# Patient Record
Sex: Male | Born: 1980 | Hispanic: No | Marital: Single | Smoking: Never smoker
Health system: Southern US, Community
[De-identification: ages and names within clinical notes are randomized; demographics above are authoritative.]

## PROBLEM LIST (undated history)

## (undated) DIAGNOSIS — I10 Essential (primary) hypertension: Secondary | ICD-10-CM

---

## 2002-06-24 ENCOUNTER — Emergency Department (HOSPITAL_COMMUNITY): Admission: EM | Admit: 2002-06-24 | Discharge: 2002-06-24 | Payer: Self-pay | Admitting: *Deleted

## 2018-04-01 ENCOUNTER — Emergency Department (HOSPITAL_COMMUNITY)
Admission: EM | Admit: 2018-04-01 | Discharge: 2018-04-01 | Disposition: A | Discharge status: Home / Self Care | Payer: Self-pay | Attending: Emergency Medicine | Admitting: Emergency Medicine

## 2018-04-01 ENCOUNTER — Emergency Department (HOSPITAL_COMMUNITY): Payer: Self-pay

## 2018-04-01 ENCOUNTER — Other Ambulatory Visit: Payer: Self-pay

## 2018-04-01 ENCOUNTER — Encounter (HOSPITAL_COMMUNITY): Payer: Self-pay

## 2018-04-01 DIAGNOSIS — N131 Hydronephrosis with ureteral stricture, not elsewhere classified: Secondary | ICD-10-CM | POA: Insufficient documentation

## 2018-04-01 DIAGNOSIS — R9431 Abnormal electrocardiogram [ECG] [EKG]: Secondary | ICD-10-CM | POA: Insufficient documentation

## 2018-04-01 DIAGNOSIS — N200 Calculus of kidney: Secondary | ICD-10-CM

## 2018-04-01 DIAGNOSIS — R112 Nausea with vomiting, unspecified: Secondary | ICD-10-CM | POA: Insufficient documentation

## 2018-04-01 DIAGNOSIS — N201 Calculus of ureter: Secondary | ICD-10-CM | POA: Insufficient documentation

## 2018-04-01 LAB — CBC
HCT: 39.7 % (ref 39.0–52.0)
Hemoglobin: 13.4 g/dL (ref 13.0–17.0)
MCH: 28.2 pg (ref 26.0–34.0)
MCHC: 33.8 g/dL (ref 30.0–36.0)
MCV: 83.6 fL (ref 78.0–100.0)
Platelets: 280 10*3/uL (ref 150–400)
RBC: 4.75 MIL/uL (ref 4.22–5.81)
RDW: 13.1 % (ref 11.5–15.5)
WBC: 6.3 10*3/uL (ref 4.0–10.5)

## 2018-04-01 LAB — URINALYSIS, MICROSCOPIC (REFLEX)
Bacteria, UA: NONE SEEN
Squamous Epithelial / LPF: NONE SEEN (ref 0–5)

## 2018-04-01 LAB — URINALYSIS, ROUTINE W REFLEX MICROSCOPIC
Bilirubin Urine: NEGATIVE
Glucose, UA: NEGATIVE mg/dL
Ketones, ur: NEGATIVE mg/dL
Leukocytes, UA: NEGATIVE
Nitrite: NEGATIVE
Protein, ur: NEGATIVE mg/dL
Specific Gravity, Urine: 1.025 (ref 1.005–1.030)
pH: 5.5 (ref 5.0–8.0)

## 2018-04-01 LAB — BASIC METABOLIC PANEL
Anion gap: 9 (ref 5–15)
BUN: 9 mg/dL (ref 6–20)
CO2: 20 mmol/L — ABNORMAL LOW (ref 22–32)
Calcium: 8.3 mg/dL — ABNORMAL LOW (ref 8.9–10.3)
Chloride: 110 mmol/L (ref 101–111)
Creatinine, Ser: 1.03 mg/dL (ref 0.61–1.24)
GFR calc Af Amer: >60 mL/min (ref >60)
GFR calc non Af Amer: >60 mL/min (ref >60)
Glucose, Bld: 128 mg/dL — ABNORMAL HIGH (ref 65–99)
Potassium: 4.1 mmol/L (ref 3.5–5.1)
Sodium: 139 mmol/L (ref 135–145)

## 2018-04-01 LAB — I-STAT TROPONIN, ED: Troponin i, poc: 0 ng/mL (ref 0.00–0.08)

## 2018-04-01 LAB — D-DIMER, QUANTITATIVE: D-Dimer, Quant: <0.27 ug/mL-FEU (ref 0.00–0.50)

## 2018-04-01 MED ORDER — MORPHINE SULFATE (PF) 4 MG/ML IV SOLN
4.0000 mg | Freq: Once | INTRAVENOUS | Status: AC
  Administered 2018-04-01: 4 mg via INTRAVENOUS
  Filled 2018-04-01: qty 1

## 2018-04-01 MED ORDER — TRAMADOL HCL 50 MG PO TABS: 50.0000 mg | ORAL_TABLET | Freq: Four times a day (QID) | ORAL | 0 refills | Status: AC | PRN

## 2018-04-01 MED ORDER — TAMSULOSIN HCL 0.4 MG PO CAPS: 0.4000 mg | ORAL_CAPSULE | Freq: Every day | ORAL | 0 refills | Status: AC

## 2018-04-01 MED ORDER — ONDANSETRON HCL 4 MG/2ML IJ SOLN
4.0000 mg | Freq: Once | INTRAMUSCULAR | Status: AC
  Administered 2018-04-01: 4 mg via INTRAVENOUS
  Filled 2018-04-01: qty 2

## 2018-04-01 MED ORDER — IBUPROFEN 400 MG PO TABS: 400.0000 mg | ORAL_TABLET | Freq: Three times a day (TID) | ORAL | 0 refills | Status: AC

## 2018-04-01 MED ORDER — METOCLOPRAMIDE HCL 5 MG/ML IJ SOLN
10.0000 mg | Freq: Once | INTRAMUSCULAR | Status: AC
  Administered 2018-04-01: 10 mg via INTRAVENOUS
  Filled 2018-04-01: qty 2

## 2018-04-01 MED ORDER — KETOROLAC TROMETHAMINE 30 MG/ML IJ SOLN
15.0000 mg | Freq: Once | INTRAMUSCULAR | Status: AC
  Administered 2018-04-01: 15 mg via INTRAVENOUS
  Filled 2018-04-01: qty 1

## 2018-04-01 NOTE — ED Notes (Signed)
ST elevation noted on EMS EKG.

## 2018-04-01 NOTE — ED Provider Notes (Signed)
[facility]-EMERGENCY DEPT Provider Note   CSN: [id number] Arrival date & time: 04/01/18  [id number]     History   Chief Complaint No chief complaint on file.   HPI Thomas Owen is a 37 y.o. male.  HPI  Presents with acute onset pain in the right flank, and right inguinal greater than lower abdomen. Onset was about 2 hours ago, since onset pain is been persistent, is sore, severe, sharp. No medication taken for relief. There is associated nausea, vomiting. Patient has not urinated since onset, but has had no change in spite of a bowel movement. He states that he is generally well, denies medical problems.   History reviewed. No pertinent past medical history.  There are no active problems to display for this patient.   History reviewed. No pertinent surgical history.      Home Medications    Prior to Admission medications   Medication Sig Start Date End Date Taking? Authorizing Provider  ibuprofen (ADVIL,MOTRIN) 200 MG tablet Take 600 mg by mouth every 6 (six) hours as needed for moderate pain.   Yes [provider]    Family History No family history on file.  Social History Social History   Tobacco Use  . Smoking status: Never Smoker  . Smokeless tobacco: Never Used  Substance Use Topics  . Alcohol use: Never    Frequency: Never  . Drug use: Never     Allergies   Patient has no known allergies.   Review of Systems Review of Systems  Constitutional:       Per HPI, otherwise negative  HENT:       Per HPI, otherwise negative  Respiratory:       Per HPI, otherwise negative  Cardiovascular:       Per HPI, otherwise negative  Gastrointestinal: Positive for nausea and vomiting.  Endocrine:       Negative aside from HPI  Genitourinary:       Neg aside from HPI   Musculoskeletal:       Per HPI, otherwise negative  Skin: Negative.   Neurological: Negative for syncope.     Physical Exam Updated Vital Signs BP  115/74   Pulse 77   Temp (!) 97.4 F (36.3 C) (Oral)   Resp 17   Ht 5\' 6"  (1.676 m)   Wt 81.6 kg (180 lb)   SpO2 98%   BMI 29.05 kg/m   Physical Exam  Constitutional: He is oriented to person, place, and time. He appears well-developed. No distress.  HENT:  Head: Normocephalic and atraumatic.  Eyes: Conjunctivae and EOM are normal.  Cardiovascular: Normal rate and regular rhythm.  Pulmonary/Chest: Effort normal. No stridor. No respiratory distress.  Abdominal: He exhibits no distension. There is tenderness in the right lower quadrant and suprapubic area. There is CVA tenderness.  Musculoskeletal: He exhibits no edema.  Neurological: He is alert and oriented to person, place, and time.  Skin: Skin is warm and dry.  Psychiatric: He has a normal mood and affect.  Nursing note and vitals reviewed.    ED Treatments / Results  Labs (all labs ordered are listed, but only abnormal results are displayed) Labs Reviewed  URINALYSIS, ROUTINE W REFLEX MICROSCOPIC - Abnormal; Notable for the following components:      Result Value   Hgb urine dipstick TRACE (*)    All other components within normal limits  BASIC METABOLIC PANEL - Abnormal; Notable for the following components:   CO2 20 (*)  Glucose, Bld 128 (*)    Calcium 8.3 (*)    All other components within normal limits  CBC  D-DIMER, QUANTITATIVE (NOT AT [facility])  URINALYSIS, MICROSCOPIC (REFLEX)  I-STAT TROPONIN, ED    EKG EKG Interpretation  Date/Time:  Saturday April 01 2018 08:46:15 EDT Ventricular Rate:  72 PR Interval:    QRS Duration: 85 QT Interval:  387 QTC Calculation: 424 R Axis:   20 Text Interpretation:  Age not entered, assumed to be  37 years old for purpose of ECG interpretation Sinus rhythm Consider right atrial enlargement LVH with secondary repolarization abnormality ST-t wave abnormality Abnormal ekg Confirmed by Gerhard Munch [phone]) on 04/01/2018 9:06:33 AM   Radiology Ct Renal Stone  Study  Result Date: 04/01/2018 CLINICAL DATA:  Acute onset severe right flank pain. EXAM: CT ABDOMEN AND PELVIS WITHOUT CONTRAST TECHNIQUE: Multidetector CT imaging of the abdomen and pelvis was performed following the standard protocol without IV contrast. COMPARISON:  None. FINDINGS: Lower chest: Lung bases are clear. No pleural or pericardial effusion. Hepatobiliary: Mild fatty infiltration is noted. No focal lesion. Gallbladder and biliary tree appear normal. Pancreas: Unremarkable. No pancreatic ductal dilatation or surrounding inflammatory changes. Spleen: Normal in size without focal abnormality. Adrenals/Urinary Tract: The adrenal glands appear normal. The patient has multiple small, nonobstructing stones in both kidneys. There is mild right hydronephrosis with some stranding about the right ureter due to a 0.3 cm stone at the right ureterovesical junction. The urinary bladder is completely decompressed. Stomach/Bowel: Stomach is within normal limits. Appendix appears normal. No evidence of bowel wall thickening, distention, or inflammatory changes. Vascular/Lymphatic: No significant vascular findings are present. No enlarged abdominal or pelvic lymph nodes. Reproductive: Prostate is unremarkable. Other: No ascites. Musculoskeletal: Negative. IMPRESSION: Mild right hydronephrosis due to a 0.3 cm stone at the right ureterovesical junction. The patient has multiple small nonobstructing stones in both kidneys. Mild fatty infiltration of the liver. Electronically Signed   By: Drusilla Kanner M.D.   On: 04/01/2018 09:49    Procedures Procedures (including critical care time)  Medications Ordered in ED Medications  ketorolac (TORADOL) 30 MG/ML injection 15 mg (15 mg Intravenous Given 04/01/18 0920)  ondansetron (ZOFRAN) injection 4 mg (4 mg Intravenous Given 04/01/18 0919)  morphine 4 MG/ML injection 4 mg (4 mg Intravenous Given 04/01/18 0929)  metoCLOPramide (REGLAN) injection 10 mg (10 mg Intravenous  Given 04/01/18 0928)     Initial Impression / Assessment and Plan / ED Course  I have reviewed the triage vital signs and the nursing notes.  Pertinent labs & imaging results that were available during my care of the patient were reviewed by me and considered in my medical decision making (see chart for details).     10:55 AM On repeat exam the patient is awake, alert, in no distress. Patient does have abnormal EKG, but has no chest pain, there is low suspicion for ongoing ischemia. In addition, patient has normal troponin, negative d-dimer,  no evidence for ongoing pulmonary embolism, little suspicion for dissection given the reassuring CT scan.  Patient states that he has had kidney stones in the past, though he initially denied this. He had substantial improvement after fluids, Toradol, morphine. Discussed all findings with him and multiple family members.  Absent evidence for obstruction or infection, with stone less than 0.6 mm, the patient was discharged with ongoing analgesia to follow-up with urology.   Final Clinical Impressions(s) / ED Diagnoses  Kidney stone   Gerhard Munch, MD 04/01/18 1056

## 2018-04-01 NOTE — ED Notes (Signed)
Bed: [id number] Expected date:  Expected time:  Means of arrival:  Comments: 37 yo flank pain

## 2018-04-01 NOTE — ED Triage Notes (Signed)
BIBA. Severe right flank pain. Hx of kidney stones. Received fent en route 202/100 to 154/92 after fentanyl. HR 70 97% RA. 18RR. Last void last night.

## 2018-11-10 IMAGING — CT CT RENAL STONE PROTOCOL
2 of 4 series · 17 of 46 positions shown, 19 images · non-contrast
Comparison: None.

CLINICAL DATA: Acute onset severe right flank pain.

EXAM:
CT ABDOMEN AND PELVIS WITHOUT CONTRAST
TECHNIQUE: Multidetector CT imaging of the abdomen and pelvis was performed
following the standard protocol without IV contrast.

[Series 2: axial st · axial · 0.75mm/px · z∈[+1125,+1535]mm · 14 of 94 slices shown, 16 images]
[im 6/94  soft-tissue]
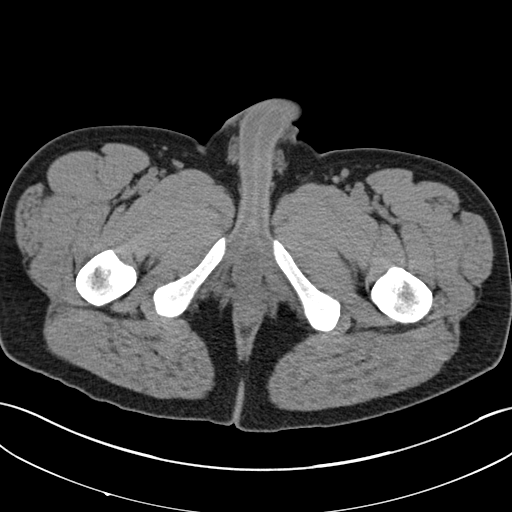
[im 6/94  bone]
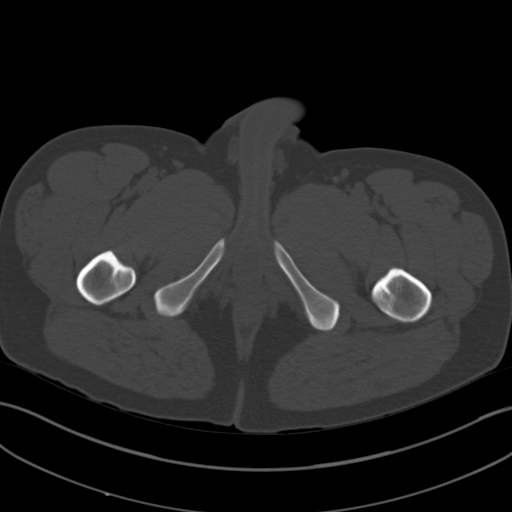
[im 11/94  soft-tissue]
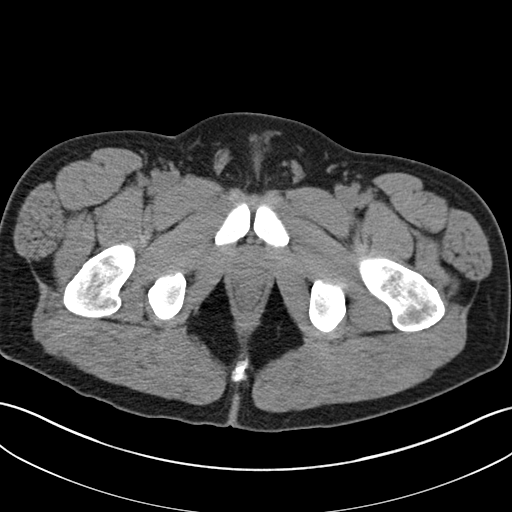
[im 21/94  soft-tissue]
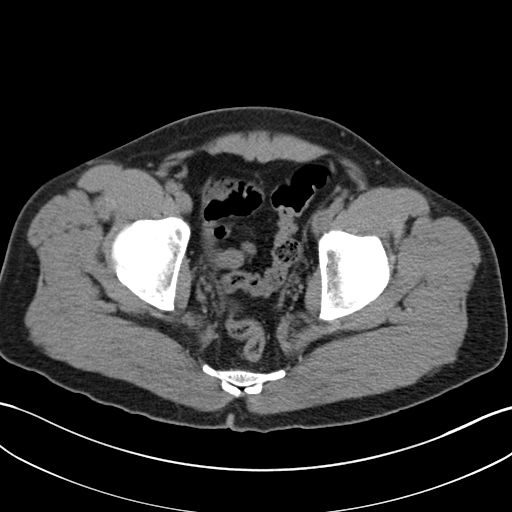
[im 26/94  soft-tissue]
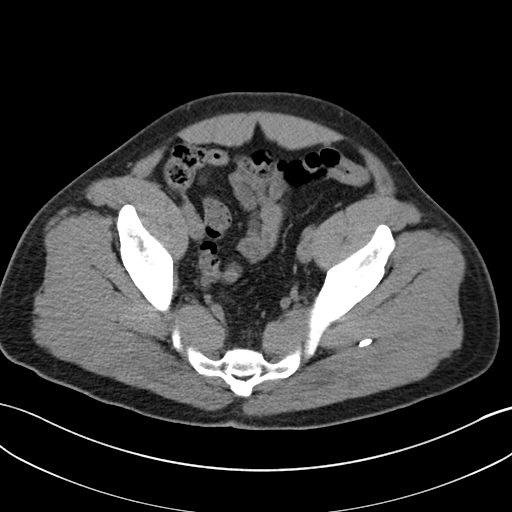
[im 32/94  soft-tissue]
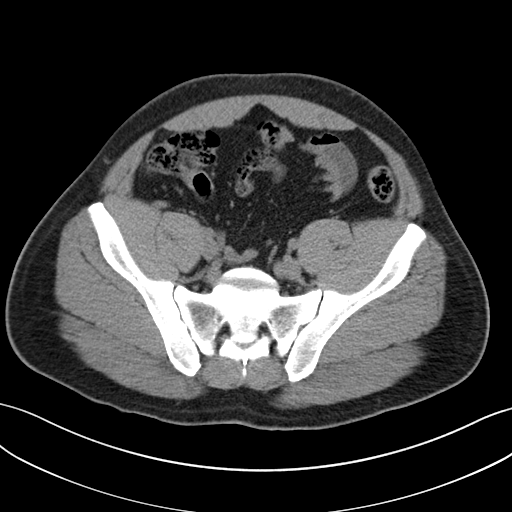
[im 37/94  soft-tissue]
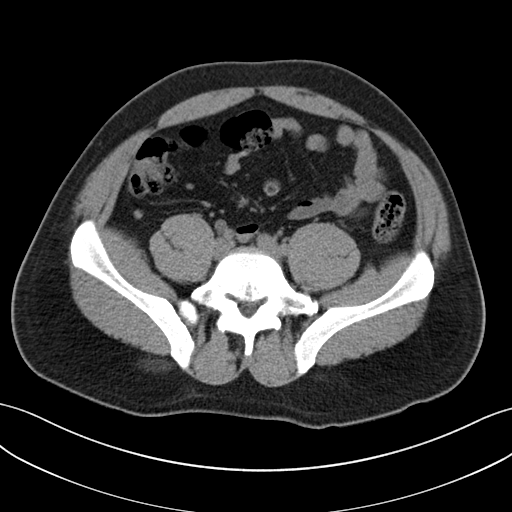
[im 42/94  soft-tissue]
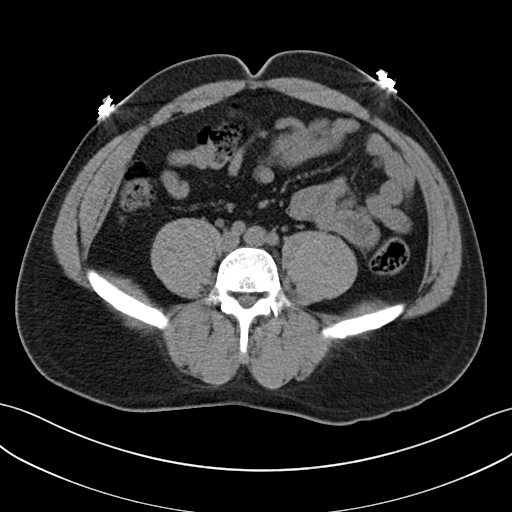
[im 52/94  soft-tissue]
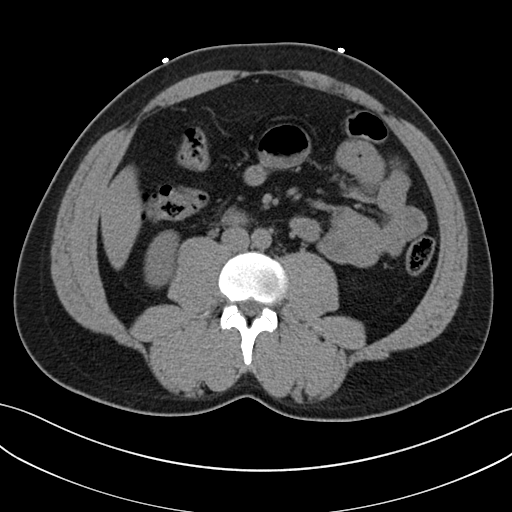
[im 57/94  soft-tissue]
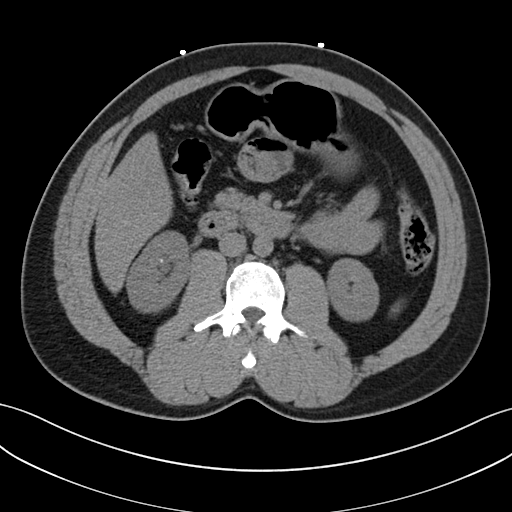
[im 57/94  bone]
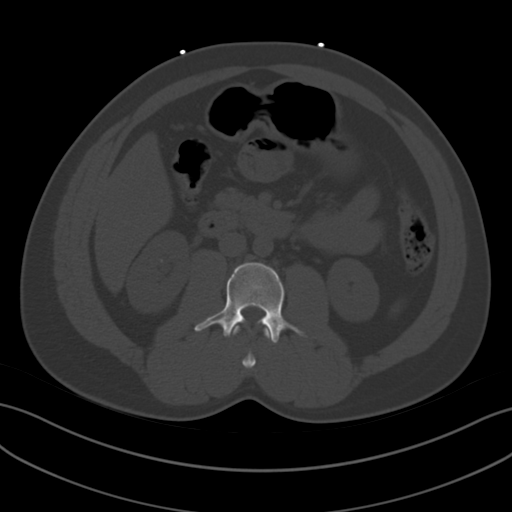
[im 63/94  soft-tissue]
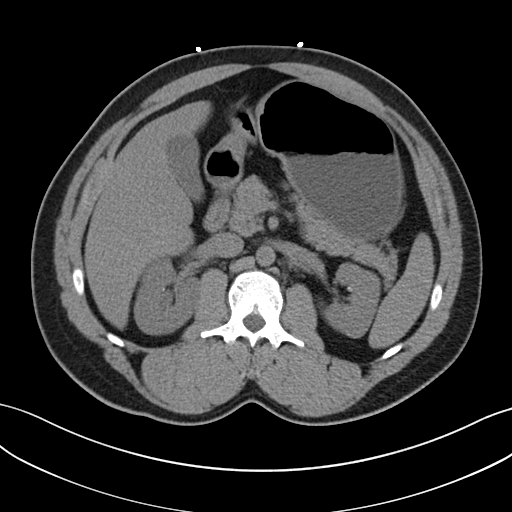
[im 68/94  soft-tissue]
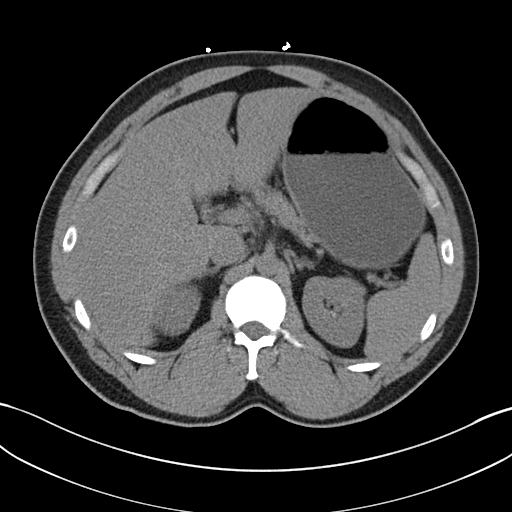
[im 73/94  soft-tissue]
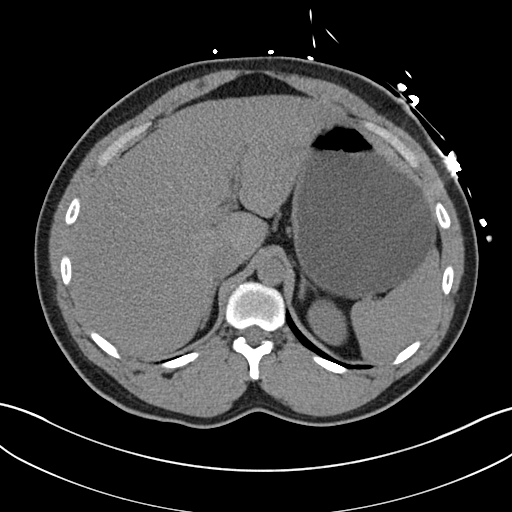
[im 83/94  soft-tissue]
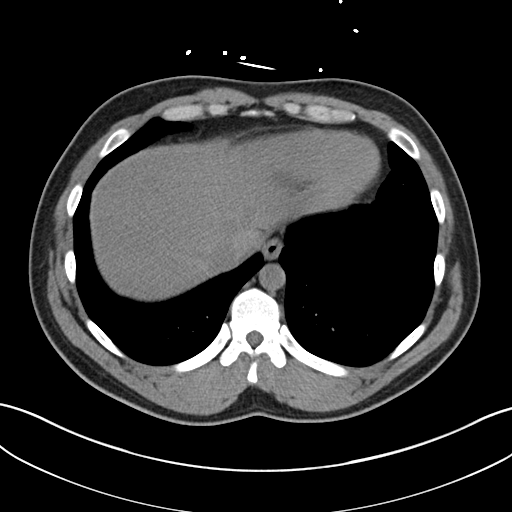
[im 88/94  soft-tissue]
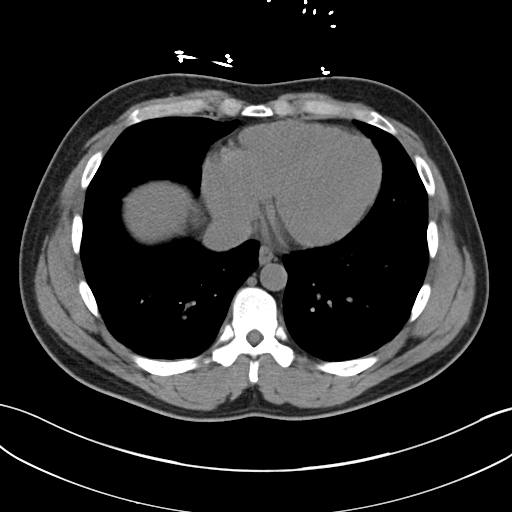

[Series 4: coronal · coronal · 0.89mm/px · 3 of 127 slices shown]
[im 43/127  soft-tissue]
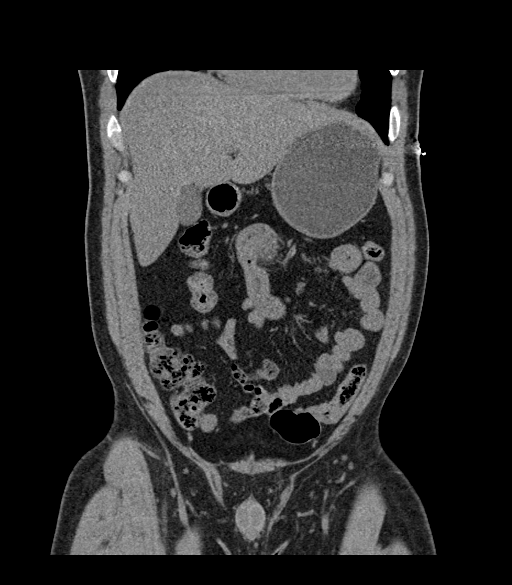
[im 57/127  soft-tissue]
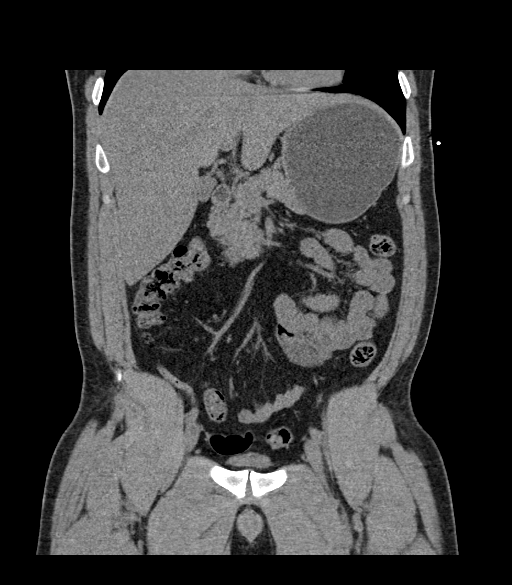
[im 71/127  soft-tissue]
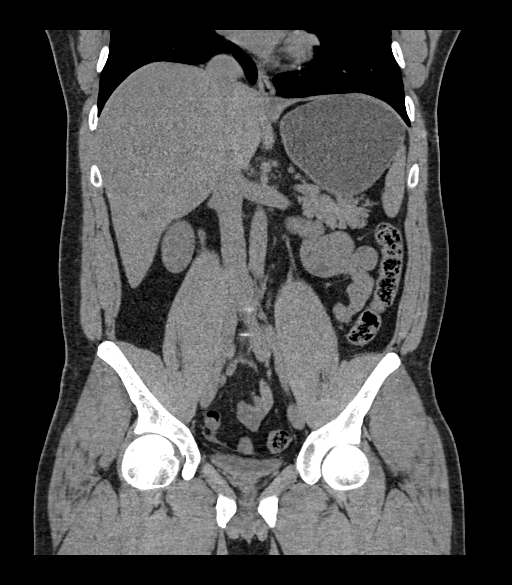

[17 of 46 positions shown; findings below may reference images not displayed]

FINDINGS: Lower chest: Lung bases are clear. No pleural or pericardial
effusion.

Hepatobiliary: Mild fatty infiltration is noted. No focal lesion.
Gallbladder and biliary tree appear normal.

Pancreas: Unremarkable. No pancreatic ductal dilatation or
surrounding inflammatory changes.

Spleen: Normal in size without focal abnormality.

Adrenals/Urinary Tract: The adrenal glands appear normal. The
patient has multiple small, nonobstructing stones in both kidneys.
There is mild right hydronephrosis with some stranding about the
right ureter due to a 0.3 cm stone at the right ureterovesical
junction. The urinary bladder is completely decompressed.

Stomach/Bowel: Stomach is within normal limits. Appendix appears
normal. No evidence of bowel wall thickening, distention, or
inflammatory changes.

Vascular/Lymphatic: No significant vascular findings are present. No
enlarged abdominal or pelvic lymph nodes.

Reproductive: Prostate is unremarkable.

Other: No ascites.

Musculoskeletal: Negative.
IMPRESSION: Mild right hydronephrosis due to a 0.3 cm stone at the right
ureterovesical junction. The patient has multiple small
nonobstructing stones in both kidneys.

Mild fatty infiltration of the liver.

## 2021-07-03 ENCOUNTER — Emergency Department (HOSPITAL_COMMUNITY)
Admission: EM | Admit: 2021-07-03 | Discharge: 2021-07-03 | Disposition: A | Discharge status: Home / Self Care | Payer: Self-pay | Attending: Emergency Medicine | Admitting: Emergency Medicine

## 2021-07-03 ENCOUNTER — Emergency Department (HOSPITAL_COMMUNITY): Payer: Self-pay

## 2021-07-03 ENCOUNTER — Other Ambulatory Visit: Payer: Self-pay

## 2021-07-03 DIAGNOSIS — R110 Nausea: Secondary | ICD-10-CM | POA: Insufficient documentation

## 2021-07-03 DIAGNOSIS — R030 Elevated blood-pressure reading, without diagnosis of hypertension: Secondary | ICD-10-CM | POA: Insufficient documentation

## 2021-07-03 DIAGNOSIS — R079 Chest pain, unspecified: Secondary | ICD-10-CM | POA: Insufficient documentation

## 2021-07-03 DIAGNOSIS — R42 Dizziness and giddiness: Secondary | ICD-10-CM | POA: Insufficient documentation

## 2021-07-03 LAB — CBC
HCT: 42 % (ref 39.0–52.0)
Hemoglobin: 14.3 g/dL (ref 13.0–17.0)
MCH: 28.1 pg (ref 26.0–34.0)
MCHC: 34 g/dL (ref 30.0–36.0)
MCV: 82.5 fL (ref 80.0–100.0)
Platelets: 322 10*3/uL (ref 150–400)
RBC: 5.09 MIL/uL (ref 4.22–5.81)
RDW: 12.4 % (ref 11.5–15.5)
WBC: 5.9 10*3/uL (ref 4.0–10.5)
nRBC: 0 % (ref 0.0–0.2)

## 2021-07-03 LAB — BASIC METABOLIC PANEL
Anion gap: 8 (ref 5–15)
BUN: 9 mg/dL (ref 6–20)
CO2: 25 mmol/L (ref 22–32)
Calcium: 9.3 mg/dL (ref 8.9–10.3)
Chloride: 105 mmol/L (ref 98–111)
Creatinine, Ser: 1.15 mg/dL (ref 0.61–1.24)
GFR, Estimated: >60 mL/min (ref >60)
Glucose, Bld: 103 mg/dL — ABNORMAL HIGH (ref 70–99)
Potassium: 4.3 mmol/L (ref 3.5–5.1)
Sodium: 138 mmol/L (ref 135–145)

## 2021-07-03 LAB — TROPONIN I (HIGH SENSITIVITY)
Troponin I (High Sensitivity): 6 ng/L (ref <18)
Troponin I (High Sensitivity): 6 ng/L (ref <18)

## 2021-07-03 NOTE — ED Triage Notes (Signed)
[place] [address] [place] [address] c/o dizziness. Per ems , pt started feeling dizzy and palpitations while driving, thus called ems. Denied N/V.  A&O X4.   Ems vitals:  -BP 176/112  -pulse 76  -RR 18  - O2 99%

## 2021-07-03 NOTE — Discharge Instructions (Addendum)
Follow-up with a primary care provider, referral given to [facility] health and wellness, call to schedule an appointment.  Return to the emergency room for worsening or concerning symptoms.

## 2021-07-03 NOTE — ED Provider Notes (Signed)
[facility] EMERGENCY DEPARTMENT Provider Note   CSN: [id number] Arrival date & time: 07/03/21  1325     History Chief Complaint  Patient presents with   Dizziness   Hypertension    Thomas Owen is a 40 y.o. male.  40 year old male with complaint of not feeling well today. 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 Then began to feel a pounding sensation in his chest with difficulty breathing, pulled his car over and called 911. Episode feels intermittent, no symptoms at this time. Similar event 1 year ago when driving home from [place], called 911 but did not go to the hospital that time. Today, EMS told patient his BP was high and recommended he come to the ER.  BP has improved upon arrival in the ER. Family history significant for MI in grandfather at 49 years old. Father died at 100yo but not cardiac related. Concerned about his health due to family history.  No history but does not go to PCP, non smoker.      No past medical history on file.  There are no problems to display for this patient.   No past surgical history on file.     No family history on file.  Social History   Tobacco Use   Smoking status: Never   Smokeless tobacco: Never  Substance Use Topics   Alcohol use: Never   Drug use: Never    Home Medications Prior to Admission medications   Medication Sig Start Date End Date Taking? Authorizing Provider  ibuprofen (ADVIL,MOTRIN) 200 MG tablet Take 600 mg by mouth every 6 (six) hours as needed for moderate pain.    [provider]  tamsulosin (FLOMAX) 0.4 MG CAPS capsule Take 1 capsule (0.4 mg total) by mouth daily after supper. 04/01/18   Gerhard Munch, MD  traMADol  (ULTRAM) 50 MG tablet Take 1 tablet (50 mg total) by mouth every 6 (six) hours as needed. 04/01/18   Gerhard Munch, MD    Allergies    Patient has no known allergies.  Review of Systems   Review of Systems  Constitutional:  Negative for chills, diaphoresis and fever.  Eyes:  Negative for visual disturbance.  Respiratory:  Positive for shortness of breath.   Cardiovascular:  Positive for chest pain and palpitations. Negative for leg swelling.  Gastrointestinal:  Negative for abdominal pain, constipation, diarrhea, nausea and vomiting.  Genitourinary:  Negative for difficulty urinating and dysuria.  Musculoskeletal:  Negative for arthralgias, gait problem and myalgias.  Skin:  Negative for rash and wound.  Allergic/Immunologic: Negative for immunocompromised state.  Neurological:  Positive for light-headedness. Negative for speech difficulty, weakness and headaches.  Psychiatric/Behavioral:  Negative for confusion.   All other systems reviewed and are negative.  Physical Exam Updated Vital Signs BP (!) 143/91   Pulse 79   Temp 98.2 F (36.8 C)   Resp 15   SpO2 98%   Physical Exam Vitals and nursing note reviewed.  Constitutional:      General: He is not in acute distress.    Appearance: He is well-developed. He is not diaphoretic.  HENT:     Head: Normocephalic and atraumatic.     Nose:  Nose normal.     Mouth/Throat:     Mouth: Mucous membranes are moist.  Eyes:     Extraocular Movements: Extraocular movements intact.     Pupils: Pupils are equal, round, and reactive to light.  Cardiovascular:     Rate and Rhythm: Normal rate and regular rhythm.     Pulses: Normal pulses.     Heart sounds: Normal heart sounds.  Pulmonary:     Effort: Pulmonary effort is normal.     Breath sounds: Normal breath sounds.  Abdominal:     Palpations: Abdomen is soft.     Tenderness: There is no abdominal tenderness.  Musculoskeletal:     Cervical back: Neck supple.     Right lower  leg: No edema.     Left lower leg: No edema.  Skin:    General: Skin is warm and dry.     Findings: No erythema or rash.  Neurological:     Mental Status: He is alert and oriented to person, place, and time.     Cranial Nerves: No cranial nerve deficit.     Motor: No weakness.  Psychiatric:        Behavior: Behavior normal.    ED Results / Procedures / Treatments   Labs (all labs ordered are listed, but only abnormal results are displayed) Labs Reviewed  BASIC METABOLIC PANEL - Abnormal; Notable for the following components:      Result Value   Glucose, Bld 103 (*)    All other components within normal limits  CBC  TROPONIN I (HIGH SENSITIVITY)  TROPONIN I (HIGH SENSITIVITY)    EKG EKG Interpretation  Date/Time:  Friday July 03 2021 13:32:21 EDT Ventricular Rate:  71 PR Interval:  162 QRS Duration: 93 QT Interval:  380 QTC Calculation: 413 R Axis:   35 Text Interpretation: Sinus rhythm Consider right atrial enlargement No sig change from Jun 2019 ecg, same T wave inferior/lateral inversions, no STEMI Confirmed by Alvester Chou [id number]) on 07/03/2021 2:31:54 PM  Radiology DG Chest Port 1 View  Result Date: 07/03/2021 CLINICAL DATA:  Chest pain. EXAM: PORTABLE CHEST 1 VIEW COMPARISON:  None. FINDINGS: The heart size and mediastinal contours are within normal limits. Both lungs are clear. The visualized skeletal structures are unremarkable. IMPRESSION: No active disease. Electronically Signed   By: Lupita Raider M.D.   On: 07/03/2021 14:44    Procedures Procedures   Medications Ordered in ED Medications - No data to display  ED Course  I have reviewed the triage vital signs and the nursing notes.  Pertinent labs & imaging results that were available during my care of the patient were reviewed by me and considered in my medical decision making (see chart for details).  Clinical Course as of 07/03/21 1707  Fri Jul 03, 2021  [id number] 39 year old male with complaint  of not feeling well today. 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 Then began to feel a pounding sensation in his chest with difficulty breathing, pulled his car over and called 911. Episode feels intermittent, no symptoms at this time. Similar event 1 year ago when driving home from [place], called 911 but did not go to the hospital that  time. Today, EMS told patient his BP was high and recommended he come to the ER.  BP has improved upon arrival in the ER. Family history significant for MI in grandfather at 35 years old. Father died at 61yo but not cardiac related. Concerned about his health due to family history.  No history but does not go to PCP, non smoker. [LM]  1706 Work-up is unremarkable, EKG compared to prior, no acute changes.  CBC and BMP within normal limits, troponin x2 without significant change at 6 and 6.  EKG unremarkable.  Blood pressure remains slightly elevated, currently 143/91.  O2 sat reassuring at 98% on room air.  Patient continues to feel well and without any pain.  Referred to [facility] health and wellness to establish care for follow-up.  Advised to monitor blood pressure, record readings to take to PCP follow-up.  Return to ER for new or worsening symptoms. [LM]    Clinical Course User Index [LM] Alden Hipp   MDM Rules/Calculators/A&P                           Final Clinical Impression(s) / ED Diagnoses Final diagnoses:  Chest pain, unspecified type  Lightheadedness  Elevated blood pressure reading    Rx / DC Orders ED Discharge Orders     None        Jeannie Fend, PA-C 07/03/21 1707    Terald Sleeper, MD 07/03/21 [phone]

## 2021-07-13 ENCOUNTER — Other Ambulatory Visit: Payer: Self-pay

## 2021-07-13 ENCOUNTER — Encounter: Payer: Self-pay | Admitting: Nurse Practitioner

## 2021-07-13 ENCOUNTER — Ambulatory Visit (INDEPENDENT_AMBULATORY_CARE_PROVIDER_SITE_OTHER): Payer: Self-pay | Admitting: Nurse Practitioner

## 2021-07-13 VITALS — BP 145/88 | HR 75 | Temp 97.9°F | Resp 18 | Ht 65.98 in | Wt 194.2 lb

## 2021-07-13 DIAGNOSIS — F411 Generalized anxiety disorder: Secondary | ICD-10-CM

## 2021-07-13 DIAGNOSIS — R42 Dizziness and giddiness: Secondary | ICD-10-CM

## 2021-07-13 DIAGNOSIS — R079 Chest pain, unspecified: Secondary | ICD-10-CM

## 2021-07-13 DIAGNOSIS — R55 Syncope and collapse: Secondary | ICD-10-CM

## 2021-07-13 MED ORDER — ESCITALOPRAM OXALATE 10 MG PO TABS: 10.0000 mg | ORAL_TABLET | Freq: Every day | ORAL | 0 refills | Status: AC

## 2021-07-13 MED ORDER — OMEPRAZOLE 20 MG PO CPDR: 20.0000 mg | DELAYED_RELEASE_CAPSULE | Freq: Every day | ORAL | 3 refills | Status: AC

## 2021-07-13 NOTE — Progress Notes (Signed)
@Patient  ID: Thomas Owen, male    DOB: August 26, 1981, 40 y.o.   MRN: [id number]  Chief Complaint  Patient presents with   Follow-up    ED VISIT 07/03/2021    Referring provider: No ref. provider found  HPI  Patient presents today for ED follow-up.  Patient was seen in the ED on 07/03/2021 for chest pain and elevated blood pressure.  Overall cardiac work-up was unremarkable.  Chest x-ray was clear.  Patient states that he has been having issues with anxiety related to work.  He thinks that the episodes that he has been having with chest pain and blood pressure are due to panic attacks.  He is requesting medication for anxiety. He also complains today of ongoing acid reflux.  He currently does not take any medications for this.  Patient does have a family history of heart disease.  Patient's blood pressure is borderline this morning.  We will place referral to cardiology. We discussed that we will start him on some medication for anxiety and reflux.  He needs to keep a home blood pressure log and we will get him an appointment to follow-up to establish care with a PCP and have a complete physical.  Patient is a non-smoker. Denies f/c/s, n/v/d, hemoptysis, PND, chest pain or edema.   No Known Allergies   There is no immunization history on file for this patient.  History reviewed. No pertinent past medical history.  Tobacco History: Social History   Tobacco Use  Smoking Status Never  Smokeless Tobacco Never   Counseling given: Yes   Outpatient Encounter Medications as of 07/13/2021  Medication Sig   escitalopram (LEXAPRO) 10 MG tablet Take 1 tablet (10 mg total) by mouth daily.   omeprazole (PRILOSEC) 20 MG capsule Take 1 capsule (20 mg total) by mouth daily.   ibuprofen (ADVIL,MOTRIN) 200 MG tablet Take 600 mg by mouth every 6 (six) hours as needed for moderate pain.   tamsulosin (FLOMAX) 0.4 MG CAPS capsule Take 1 capsule (0.4 mg total) by mouth daily after supper.   traMADol  (ULTRAM) 50 MG tablet Take 1 tablet (50 mg total) by mouth every 6 (six) hours as needed.   No facility-administered encounter medications on file as of 07/13/2021.     Review of Systems  Review of Systems  Constitutional: Negative.   HENT: Negative.    Cardiovascular: Negative.   Gastrointestinal: Negative.   Allergic/Immunologic: Negative.   Neurological:  Positive for light-headedness.  Psychiatric/Behavioral:  The patient is nervous/anxious.       Physical Exam  BP (!) 145/88 (BP Location: Left Arm, Patient Position: Sitting, Cuff Size: Large)   Pulse 75   Temp 97.9 F (36.6 C)   Resp 18   Ht 5' 5.98" (1.676 m)   Wt 194 lb 3.2 oz (88.1 kg)   SpO2 98%   BMI 31.36 kg/m   Wt Readings from Last 5 Encounters:  07/13/21 194 lb 3.2 oz (88.1 kg)  04/01/18 180 lb (81.6 kg)     Physical Exam Vitals and nursing note reviewed.  Constitutional:      General: He is not in acute distress.    Appearance: He is well-developed.  Cardiovascular:     Rate and Rhythm: Normal rate and regular rhythm.  Pulmonary:     Effort: Pulmonary effort is normal.     Breath sounds: Normal breath sounds.  Skin:    General: Skin is warm and dry.  Neurological:     Mental Status:  He is alert and oriented to person, place, and time.     Lab Results:  CBC    Component Value Date/Time   WBC 5.9 07/03/2021 1420   RBC 5.09 07/03/2021 1420   HGB 14.3 07/03/2021 1420   HCT 42.0 07/03/2021 1420   PLT 322 07/03/2021 1420   MCV 82.5 07/03/2021 1420   MCH 28.1 07/03/2021 1420   MCHC 34.0 07/03/2021 1420   RDW 12.4 07/03/2021 1420    BMET    Component Value Date/Time   NA 138 07/03/2021 1420   K 4.3 07/03/2021 1420   CL 105 07/03/2021 1420   CO2 25 07/03/2021 1420   GLUCOSE 103 (H) 07/03/2021 1420   BUN 9 07/03/2021 1420   CREATININE 1.15 07/03/2021 1420   CALCIUM 9.3 07/03/2021 1420   GFRNONAA >60 07/03/2021 1420   GFRAA >60 04/01/2018 0852    BNP No results found for:  BNP  ProBNP No results found for: PROBNP  Imaging: DG Chest Port 1 View  Result Date: 07/03/2021 CLINICAL DATA:  Chest pain. EXAM: PORTABLE CHEST 1 VIEW COMPARISON:  None. FINDINGS: The heart size and mediastinal contours are within normal limits. Both lungs are clear. The visualized skeletal structures are unremarkable. IMPRESSION: No active disease. Electronically Signed   By: Lupita Raider M.D.   On: 07/03/2021 14:44     Assessment & Plan:   Chest pain Chest pain Elevated BP:  Will refer to cardiology  Stay active  Stay well hydrated  Keep blood pressure log  Heart healthy diet  Anxiety:  Will start lexapro  GERD:  Will start Prilosec  Follow up:  Follow up in 1 month to establish care with Dr. Andrey Campanile or Amy - will need physical and follow up on anxiety  Patient Instructions  Chest pain Elevated BP:  Will refer to cardiology  Stay active  Stay well hydrated  Keep blood pressure log  Heart healthy diet  Anxiety:  Will start lexapro  GERD:  Will start Prilosec  Follow up:  Follow up in 1 month to establish care with Dr. Andrey Campanile or Amy - will need physical and follow up on anxiety  Blood Pressure Record Sheet To take your blood pressure, you will need a blood pressure machine. You can buy a blood pressure machine (blood pressure monitor) at your clinic, drug store, or online. When choosing one, consider: An automatic monitor that has an arm cuff. A cuff that wraps snugly around your upper arm. You should be able to fit only one finger between your arm and the cuff. A device that stores blood pressure reading results. Do not choose a monitor that measures your blood pressure from your wrist or finger. Follow your health care provider's instructions for how to take your blood pressure. To use this form: Get one reading in the morning (a.m.) before you take any medicines. Get one reading in the evening (p.m.) before supper. Take at least 2  readings with each blood pressure check. This makes sure the results are correct. Wait 1-2 minutes between measurements. Write down the results in the spaces on this form. Repeat this once a week, or as told by your health care provider. Make a follow-up appointment with your health care provider to discuss the results. Blood pressure log Date: _______________________ a.m. _____________________(1st reading) _____________________(2nd reading) p.m. _____________________(1st reading) _____________________(2nd reading) Date: _______________________ a.m. _____________________(1st reading) _____________________(2nd reading) p.m. _____________________(1st reading) _____________________(2nd reading) Date: _______________________ a.m. _____________________(1st reading) _____________________(2nd reading) p.m. _____________________(1st reading) _____________________(2nd reading)  Date: _______________________ a.m. _____________________(1st reading) _____________________(2nd reading) p.m. _____________________(1st reading) _____________________(2nd reading) Date: _______________________ a.m. _____________________(1st reading) _____________________(2nd reading) p.m. _____________________(1st reading) _____________________(2nd reading) This information is not intended to replace advice given to you by your health care provider. Make sure you discuss any questions you have with your health care provider. Document Revised: 02/06/2020 Document Reviewed: 02/06/2020 Elsevier Patient Education  2022 Elsevier Inc.    Opciones de alimentos para pacientes adultos con enfermedad de reflujo gastroesofgico Food Choices for Gastroesophageal Reflux Disease, Adult Si tiene enfermedad de reflujo gastroesofgico (ERGE), los alimentos que consume y los hbitos de alimentacin son Engineer, production. Elegir los alimentos adecuados puede ayudar a [employer]. Piense en consultar a un experto en alimentacin  (nutricionista) para que lo ayude a Associate Professor. Consejos para seguir Consulting civil engineer las etiquetas de los alimentos Elija alimentos que tengan bajo contenido de grasas saturadas. Los alimentos que pueden ayudar con los sntomas incluyen los siguientes: Alimentos que tienen menos del 5 % de los valores diarios (VD) de grasa. Alimentos que tienen 0 gramos de grasas trans. Al cocinar No frer los alimentos. Cocinar la comida al horno, al vapor, a la plancha o en la parrilla. Estos son mtodos que no necesitan mucha grasa para Water quality scientist. Para agregar sabor, trate de consumir hierbas con bajo contenido de picante y [place]. Planificacin de las comidas  Elegir alimentos saludables con bajo contenido de [place], por ejemplo: Frutas y vegetales. Cereales integrales. Productos lcteos con bajo contenido de [place]. Vita Barley, pescado y aves. Haga comidas pequeas frecuentes en lugar de 3 comidas abundantes al da. Coma lentamente, en un lugar donde est relajado. Evite agacharse o recostarse hasta 2 o 3 horas despus de haber comido. Limite los alimentos con alto contenido graso como las carnes grasas o los alimentos fritos. Limite su ingesta de alimentos grasos, como aceites, [place] y [place]. Evite lo siguiente si el mdico se lo indica: Consumir alimentos que le ocasionen sntomas. Pueden ser distintos para cada persona. Lleve un registro de los alimentos para identificar aquellos que le causen sntomas. Alcohol. Beber mucha cantidad de lquido con las comidas. Comer 2 o 3 horas antes de acostarse. Estilo de vida Mantenga un peso saludable. Pregntele a su mdico cul es el peso saludable para usted. Si necesita perder peso, hable con su mdico para hacerlo de manera segura. Haga actividad fsica durante, al menos, 30 minutos 5 das por semana o ms, o como se lo haya indicado el mdico. Use ropa suelta. No fume ni consuma ningn producto que contenga nicotina o tabaco. Si necesita  ayuda para dejar de fumar, consulte al mdico. Duerma con la cabecera de la cama ms elevada que los pies. Use una cua debajo del colchn o bloques debajo del armazn de la cama para Pharmacologist la cabecera de la cama elevada. Mastique chicle sin azcar despus de las comidas. Qu alimentos debo comer? Siga una dieta saludable y bien equilibrada que incluya abundantes frutas, verduras, cereales integrales, productos lcteos descremados, carnes magras, pescado y aves. Cada persona es diferente. Los alimentos que pueden causar sntomas en una persona pueden no causar sntomas en otra persona. Trabaje con el mdico para hallar alimentos que sean seguros para usted. Es posible que los productos que se enumeran ms [place] no constituyan una lista completa de lo que puede comer y Product manager. Pngase en contacto con un experto en alimentos para conocer ms opciones. Qu alimentos debo evitar? Limitar algunos de estos alimentos puede ayudar a Chief Operating Officer los sntomas  de ERGE. Cada persona es diferente. Consulte a un experto en alimentacin o a su mdico para que lo ayude a Clinical research associate los alimentos exactos que debe evitar, si los hubiere. Frutas Cualquier fruta que est preparada con grasa agregada. Cualquier fruta que le ocasione sntomas. Para algunas personas, estas pueden incluir, las frutas ctricas como naranja, pomelo, pia y limn. Verduras Verduras fritas en abundante aceite. Papas fritas. Cualquier verdura que est preparada con grasa agregada. Cualquier verdura que le ocasione sntomas. Para algunas personas, estas pueden incluir tomates y productos con tomate, [place], cebollas y [place], y rbanos picantes. Granos Pasteles o panes sin levadura con grasa agregada. Carnes y [address] protenas [address] de alto contenido graso como carne grasa de vaca o cerdo, salchichas, costillas, jamn, salchicha, salame y tocino. Carnes o protenas fritas, lo que incluye pescado frito y pollo frito. Frutos secos y Civil engineer, contracting de  frutos secos, en grandes cantidades. Lcteos Leche entera y [place] con chocolate. Tami Ribas. Crema. Helado. Queso crema. Batidos con [employer]. Grasas y [employer]. Margarina. Lardo. Mantequilla clarificada. Bebidas Caf y t negro, con o sin cafena. Bebidas con gas. Refrescos. Bebidas energizantes. Jugo de fruta hecho con frutas cidas, como naranja o pomelo. Jugo de tomate. Bebidas alcohlicas. Dulces y postres Chocolate y cacao. Rosquillas. Alios y condimentos Pimienta. Menta y mentol. Sal agregada. Cualquier condimento, hierbas o aderezos que le ocasionen sntomas. Para algunas personas, esto puede incluir curry, salsa picante o aderezos para ensalada a base de vinagre. Es posible que los productos que se enumeran ms arriba no constituyan una lista completa de lo que no debera comer y Product manager. Pngase en contacto con un experto en alimentos para conocer ms opciones. Preguntas para hacerle al [employer] Los cambios en la dieta y en el estilo de vida a menudo son los primeros pasos que se toman para Company secretary los sntomas de [place]. Si los [employer] dieta y el estilo de vida no ayudan, hable con el mdico sobre el uso de medicamentos. Dnde buscar ms informacin Arts development officer for Gastrointestinal Disorders (Fundacin Internacional para los Trastornos Gastrointestinales): aboutgerd.org Resumen Si tiene [employer], las elecciones de alimentos y el [place] de vida son muy importantes para ayudar a Consulting civil engineer sntomas. Haga comidas pequeas durante Glass blower/designer de 3 comidas abundantes. Coma lentamente y en un lugar donde est relajado. Evite agacharse o recostarse hasta 2 o 3 horas despus de haber comido. Limite los alimentos con alto contenido graso como las carnes grasas o los alimentos fritos. Esta informacin no tiene Theme park manager el consejo del mdico. Asegrese de hacerle al mdico cualquier pregunta que tenga. Document Revised: 05/30/2020 Document Reviewed:  05/30/2020 Elsevier Patient Education  [address]          Ivonne Andrew, [place] 07/14/2021

## 2021-07-13 NOTE — Patient Instructions (Addendum)
Chest pain Elevated BP:  Will refer to cardiology  Stay active  Stay well hydrated  Keep blood pressure log  Heart healthy diet  Anxiety:  Will start lexapro  GERD:  Will start Prilosec  Follow up:  Follow up in 1 month to establish care with Dr. Andrey Campanile or Amy - will need physical and follow up on anxiety  Blood Pressure Record Sheet To take your blood pressure, you will need a blood pressure machine. You can buy a blood pressure machine (blood pressure monitor) at your clinic, drug store, or online. When choosing one, consider: An automatic monitor that has an arm cuff. A cuff that wraps snugly around your upper arm. You should be able to fit only one finger between your arm and the cuff. A device that stores blood pressure reading results. Do not choose a monitor that measures your blood pressure from your wrist or finger. Follow your health care provider's instructions for how to take your blood pressure. To use this form: Get one reading in the morning (a.m.) before you take any medicines. Get one reading in the evening (p.m.) before supper. Take at least 2 readings with each blood pressure check. This makes sure the results are correct. Wait 1-2 minutes between measurements. Write down the results in the spaces on this form. Repeat this once a week, or as told by your health care provider. Make a follow-up appointment with your health care provider to discuss the results. Blood pressure log Date: _______________________ a.m. _____________________(1st reading) _____________________(2nd reading) p.m. _____________________(1st reading) _____________________(2nd reading) Date: _______________________ a.m. _____________________(1st reading) _____________________(2nd reading) p.m. _____________________(1st reading) _____________________(2nd reading) Date: _______________________ a.m. _____________________(1st reading) _____________________(2nd reading) p.m.  _____________________(1st reading) _____________________(2nd reading) Date: _______________________ a.m. _____________________(1st reading) _____________________(2nd reading) p.m. _____________________(1st reading) _____________________(2nd reading) Date: _______________________ a.m. _____________________(1st reading) _____________________(2nd reading) p.m. _____________________(1st reading) _____________________(2nd reading) This information is not intended to replace advice given to you by your health care provider. Make sure you discuss any questions you have with your health care provider. Document Revised: 02/06/2020 Document Reviewed: 02/06/2020 Elsevier Patient Education  2022 Elsevier Inc.    Opciones de alimentos para pacientes adultos con enfermedad de reflujo gastroesofgico Food Choices for Gastroesophageal Reflux Disease, Adult Si tiene enfermedad de reflujo gastroesofgico (ERGE), los alimentos que consume y los hbitos de alimentacin son Engineer, production. Elegir los alimentos adecuados puede ayudar a [employer]. Piense en consultar a un experto en alimentacin (nutricionista) para que lo ayude a Associate Professor. Consejos para seguir Consulting civil engineer las etiquetas de los alimentos Elija alimentos que tengan bajo contenido de grasas saturadas. Los alimentos que pueden ayudar con los sntomas incluyen los siguientes: Alimentos que tienen menos del 5 % de los valores diarios (VD) de grasa. Alimentos que tienen 0 gramos de grasas trans. Al cocinar No frer los alimentos. Cocinar la comida al horno, al vapor, a la plancha o en la parrilla. Estos son mtodos que no necesitan mucha grasa para Water quality scientist. Para agregar sabor, trate de consumir hierbas con bajo contenido de picante y [place]. Planificacin de las comidas  Elegir alimentos saludables con bajo contenido de [place], por ejemplo: Frutas y vegetales. Cereales integrales. Productos lcteos con bajo contenido  de [place]. Vita Barley, pescado y aves. Haga comidas pequeas frecuentes en lugar de 3 comidas abundantes al da. Coma lentamente, en un lugar donde est relajado. Evite agacharse o recostarse hasta 2 o 3 horas despus de haber comido. Limite los alimentos con alto contenido graso como las carnes grasas o  los alimentos fritos. Limite su ingesta de alimentos grasos, como aceites, [place] y [place]. Evite lo siguiente si el mdico se lo indica: Consumir alimentos que le ocasionen sntomas. Pueden ser distintos para cada persona. Lleve un registro de los alimentos para identificar aquellos que le causen sntomas. Alcohol. Beber mucha cantidad de lquido con las comidas. [place]er 2 o 3 horas antes de acostarse. Estilo de vida Mantenga un peso saludable. Pregntele a su mdico cul es el peso saludable para usted. Si necesita perder peso, hable con su mdico para hacerlo de manera segura. Haga actividad fsica durante, al menos, 30 minutos 5 das por semana o ms, o como se lo haya indicado el mdico. Use ropa suelta. No fume ni consuma ningn producto que contenga nicotina o tabaco. Si necesita ayuda para dejar de fumar, consulte al mdico. Duerma con la cabecera de la cama ms elevada que los pies. Use una cua debajo del colchn o bloques debajo del armazn de la cama para Pharmacologist la cabecera de la cama elevada. Mastique chicle sin azcar despus de las comidas. Qu alimentos debo comer? Siga una dieta saludable y bien equilibrada que incluya abundantes frutas, verduras, cereales integrales, productos lcteos descremados, carnes magras, pescado y aves. Cada persona es diferente. Los alimentos que pueden causar sntomas en una persona pueden no causar sntomas en otra persona. Trabaje con el mdico para hallar alimentos que sean seguros para usted. Es posible que los productos que se enumeran ms [place] no constituyan una lista completa de lo que puede comer y Product manager. Pngase en contacto con un  experto en alimentos para conocer ms opciones. Qu alimentos debo evitar? Limitar algunos de estos alimentos puede ayudar a Chief Operating Officer los sntomas de [place]. Cada persona es diferente. Consulte a un experto en alimentacin o a su mdico para que lo ayude a Clinical research associate los alimentos exactos que debe evitar, si los hubiere. Frutas Cualquier fruta que est preparada con grasa agregada. Cualquier fruta que le ocasione sntomas. Para algunas personas, estas pueden incluir, las frutas ctricas como naranja, pomelo, pia y limn. Verduras Verduras fritas en abundante aceite. Papas fritas. Cualquier verdura que est preparada con grasa agregada. Cualquier verdura que le ocasione sntomas. Para algunas personas, estas pueden incluir tomates y productos con tomate, [place], cebollas y [place], y rbanos picantes. Granos Pasteles o panes sin levadura con grasa agregada. Carn[place] y [address] protenas [address] de alto contenido graso como carne grasa de vaca o cerdo, salchichas, costillas, jamn, salchicha, salame y tocino. Carnes o protenas fritas, lo que incluye pescado frito y pollo frito. Frutos secos y Civil engineer, contracting de frutos secos, en grandes cantidades. Lcteos Leche entera y [place] con chocolate. Tami Ribas. Crema. Helado. Queso crema. Batidos con [employer]. Grasas y [employer]. Margarina. Lardo. Mantequilla clarificada. Bebidas Caf y t negro, con o sin cafena. Bebidas con gas. Refrescos. Bebidas energizantes. Jugo de fruta hecho con frutas cidas, como naranja o pomelo. Jugo de tomate. Bebidas alcohlicas. Dulces y postres Chocolate y cacao. [place]. Alios y condimentos Pimienta. Menta y mentol. Sal agregada. Cualquier condimento, hierbas o aderezos que le ocasionen sntomas. Para algunas personas, esto puede incluir curry, salsa picante o aderezos para ensalada a base de vinagre. Es posible que los productos que se enumeran ms arriba no constituyan una lista completa de lo que no debera comer y  Product manager. Pngase en contacto con un experto en alimentos para conocer ms opciones. Preguntas para hacerle al [employer] Los cambios en la dieta y en el estilo de vida a menudo son los  primeros pasos que se toman para [employer] sntomas de [place]. Si los [employer] dieta y el estilo de vida no ayudan, hable con el mdico sobre el uso de medicamentos. Dnde buscar ms informacin Arts development officer for Gastrointestinal Disorders (Fundacin Internacional para los Trastornos Gastrointestinales): aboutgerd.org Resumen Si tiene [employer], las elecciones de alimentos y el [place] de vida son muy importantes para ayudar a Consulting civil engineer sntomas. Haga comidas pequeas durante Glass blower/designer de 3 comidas abundantes. Coma lentamente y en un lugar donde est relajado. Evite agacharse o recostarse hasta 2 o 3 horas despus de haber comido. Limite los alimentos con alto contenido graso como las carnes grasas o los alimentos fritos. Esta informacin no tiene Theme park manager el consejo del mdico. Asegrese de hacerle al mdico cualquier pregunta que tenga. Document Revised: 05/30/2020 Document Reviewed: 05/30/2020 Elsevier Patient Education  2022 [employer].

## 2021-07-13 NOTE — Progress Notes (Signed)
Pt presents for follow-up from ED visit on 07/03/21, pt reports symptoms of dizziness, palpitations and chest wall pain

## 2021-07-14 ENCOUNTER — Encounter: Payer: Self-pay | Admitting: Nurse Practitioner

## 2021-07-14 DIAGNOSIS — R42 Dizziness and giddiness: Secondary | ICD-10-CM | POA: Insufficient documentation

## 2021-07-14 DIAGNOSIS — R079 Chest pain, unspecified: Secondary | ICD-10-CM | POA: Insufficient documentation

## 2021-07-14 DIAGNOSIS — F411 Generalized anxiety disorder: Secondary | ICD-10-CM | POA: Insufficient documentation

## 2021-07-14 DIAGNOSIS — R55 Syncope and collapse: Secondary | ICD-10-CM | POA: Insufficient documentation

## 2021-07-14 NOTE — Assessment & Plan Note (Signed)
Chest pain Elevated BP:  Will refer to cardiology  Stay active  Stay well hydrated  Keep blood pressure log  Heart healthy diet  Anxiety:  Will start lexapro  GERD:  Will start Prilosec  Follow up:  Follow up in 1 month to establish care with Dr. Andrey Campanile or Amy - will need physical

## 2021-08-09 ENCOUNTER — Other Ambulatory Visit: Payer: Self-pay | Admitting: Nurse Practitioner

## 2021-08-13 ENCOUNTER — Ambulatory Visit: Payer: Self-pay | Admitting: Family Medicine

## 2021-08-28 NOTE — Progress Notes (Deleted)
    Thomas Posner, NP Reason for referral-chest pain and palpitations  HPI: 40 year old male for evaluation of chest pain and palpitations at request of Angus Seller, NP.  Patient seen in the emergency room July 03, 2021 with chest pain and palpitations.  Electrocardiogram showed unchanged inferolateral T wave inversion.  Chest x-ray unrevealing.  Hemoglobin 14.3; creatinine 1.15.  Cardiology now asked to evaluate.  Current Outpatient Medications  Medication Sig Dispense Refill   escitalopram (LEXAPRO) 10 MG tablet Take 1 tablet (10 mg total) by mouth daily. 30 tablet 0   ibuprofen (ADVIL,MOTRIN) 200 MG tablet Take 600 mg by mouth every 6 (six) hours as needed for moderate pain.     omeprazole (PRILOSEC) 20 MG capsule Take 1 capsule (20 mg total) by mouth daily. 30 capsule 3   tamsulosin (FLOMAX) 0.4 MG CAPS capsule Take 1 capsule (0.4 mg total) by mouth daily after supper. 30 capsule 0   traMADol (ULTRAM) 50 MG tablet Take 1 tablet (50 mg total) by mouth every 6 (six) hours as needed. 15 tablet 0   No current facility-administered medications for this visit.    No Known Allergies  No past medical history on file.  No past surgical history on file.  Social History   Socioeconomic History   Marital status: Single    Spouse name: Not on file   Number of children: Not on file   Years of education: Not on file   Highest education level: Not on file  Occupational History   Not on file  Tobacco Use   Smoking status: Never   Smokeless tobacco: Never  Vaping Use   Vaping Use: Never used  Substance and Sexual Activity   Alcohol use: Never   Drug use: Never   Sexual activity: Not on file  Other Topics Concern   Not on file  Social History Narrative   Not on file   Social Determinants of Health   Financial Resource Strain: Not on file  Food Insecurity: Not on file  Transportation Needs: Not on file  Physical Activity: Not on file  Stress: Not on file  Social  Connections: Not on file  Intimate Partner Violence: Not on file    No family history on file.  ROS: no fevers or chills, productive cough, hemoptysis, dysphasia, odynophagia, melena, hematochezia, dysuria, hematuria, rash, seizure activity, orthopnea, PND, pedal edema, claudication. Remaining systems are negative.  Physical Exam:   There were no vitals taken for this visit.  General:  Well developed/well nourished in NAD Skin warm/dry Patient not depressed No peripheral clubbing Back-normal HEENT-normal/normal eyelids Neck supple/normal carotid upstroke bilaterally; no bruits; no JVD; no thyromegaly chest - CTA/ normal expansion CV - RRR/normal S1 and S2; no murmurs, rubs or gallops;  PMI nondisplaced Abdomen -NT/ND, no HSM, no mass, + bowel sounds, no bruit 2+ femoral pulses, no bruits Ext-no edema, chords, 2+ DP Neuro-grossly nonfocal  ECG -July 03, 2021-normal sinus rhythm with inferolateral T wave inversion.  Personally reviewed  A/P  1 chest pain-  2 palpitations-  3 newly diagnosed hypertension-  Olga Millers, MD

## 2021-09-07 ENCOUNTER — Ambulatory Visit: Payer: Self-pay | Admitting: Cardiology

## 2022-02-11 IMAGING — DX DG CHEST 1V PORT
1 series · 1 of 1 positions shown · non-contrast
Comparison: None.

CLINICAL DATA: Chest pain.

EXAM:
PORTABLE CHEST 1 VIEW

[chest ap]
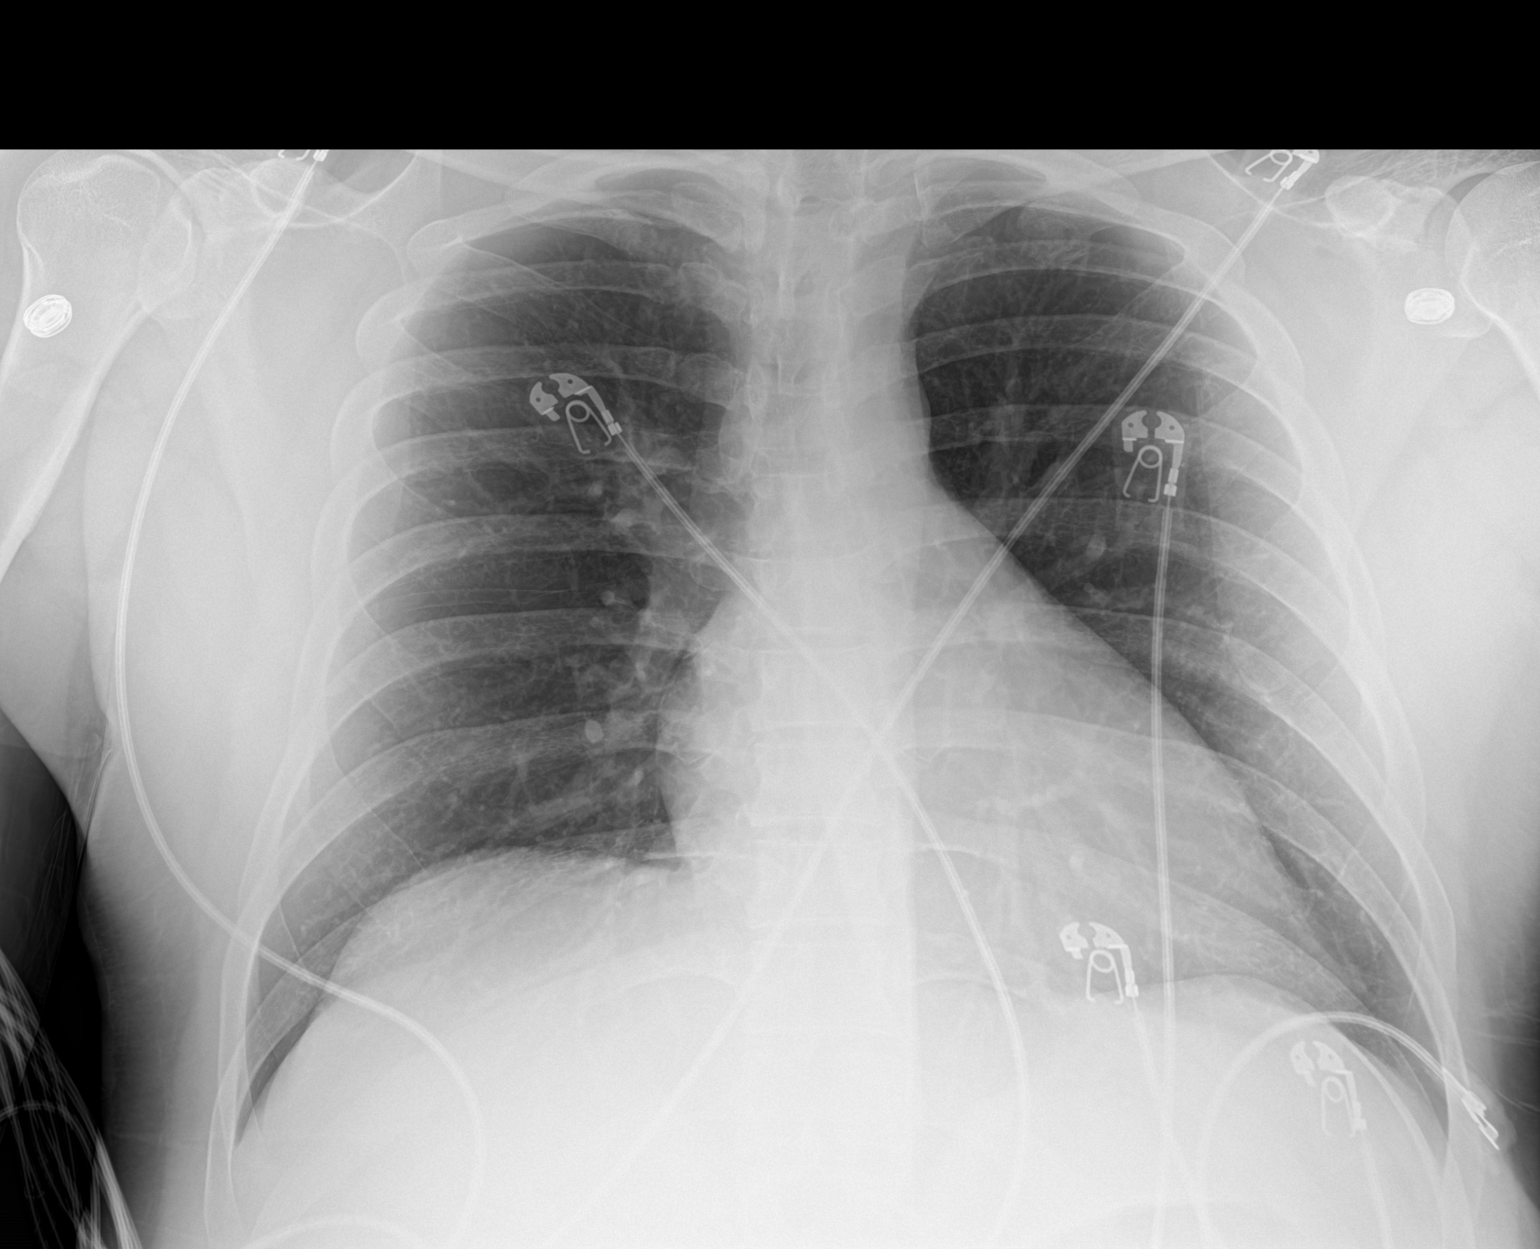

[1 of 1 positions shown; findings below may reference images not displayed]

FINDINGS: The heart size and mediastinal contours are within normal limits.
Both lungs are clear. The visualized skeletal structures are
unremarkable.
IMPRESSION: No active disease.

## 2023-10-03 ENCOUNTER — Emergency Department (HOSPITAL_COMMUNITY)
Admission: EM | Admit: 2023-10-03 | Discharge: 2023-10-03 | Disposition: A | Discharge status: Home / Self Care | Payer: Self-pay | Attending: Emergency Medicine | Admitting: Emergency Medicine

## 2023-10-03 ENCOUNTER — Encounter (HOSPITAL_COMMUNITY): Payer: Self-pay

## 2023-10-03 ENCOUNTER — Other Ambulatory Visit: Payer: Self-pay

## 2023-10-03 ENCOUNTER — Emergency Department (HOSPITAL_COMMUNITY): Payer: Self-pay

## 2023-10-03 DIAGNOSIS — R109 Unspecified abdominal pain: Secondary | ICD-10-CM | POA: Insufficient documentation

## 2023-10-03 DIAGNOSIS — M545 Low back pain, unspecified: Secondary | ICD-10-CM | POA: Insufficient documentation

## 2023-10-03 HISTORY — DX: Essential (primary) hypertension: I10

## 2023-10-03 LAB — URINALYSIS, ROUTINE W REFLEX MICROSCOPIC
Bilirubin Urine: NEGATIVE
Glucose, UA: NEGATIVE mg/dL
Hgb urine dipstick: NEGATIVE
Ketones, ur: NEGATIVE mg/dL
Leukocytes,Ua: NEGATIVE
Nitrite: NEGATIVE
Protein, ur: NEGATIVE mg/dL
Specific Gravity, Urine: 1.021 (ref 1.005–1.030)
pH: 5 (ref 5.0–8.0)

## 2023-10-03 LAB — CBC
HCT: 44.8 % (ref 39.0–52.0)
Hemoglobin: 14.9 g/dL (ref 13.0–17.0)
MCH: 27.5 pg (ref 26.0–34.0)
MCHC: 33.3 g/dL (ref 30.0–36.0)
MCV: 82.7 fL (ref 80.0–100.0)
Platelets: 335 10*3/uL (ref 150–400)
RBC: 5.42 MIL/uL (ref 4.22–5.81)
RDW: 12.5 % (ref 11.5–15.5)
WBC: 7.5 10*3/uL (ref 4.0–10.5)
nRBC: 0 % (ref 0.0–0.2)

## 2023-10-03 LAB — BASIC METABOLIC PANEL
Anion gap: 5 (ref 5–15)
BUN: 14 mg/dL (ref 6–20)
CO2: 25 mmol/L (ref 22–32)
Calcium: 8.9 mg/dL (ref 8.9–10.3)
Chloride: 104 mmol/L (ref 98–111)
Creatinine, Ser: 1.06 mg/dL (ref 0.61–1.24)
GFR, Estimated: >60 mL/min (ref >60)
Glucose, Bld: 132 mg/dL — ABNORMAL HIGH (ref 70–99)
Potassium: 4 mmol/L (ref 3.5–5.1)
Sodium: 134 mmol/L — ABNORMAL LOW (ref 135–145)

## 2023-10-03 LAB — TROPONIN I (HIGH SENSITIVITY): Troponin I (High Sensitivity): 5 ng/L (ref <18)

## 2023-10-03 MED ORDER — NAPROXEN 500 MG PO TABS: 500.0000 mg | ORAL_TABLET | Freq: Two times a day (BID) | ORAL | 0 refills | Status: AC

## 2023-10-03 MED ORDER — OXYCODONE-ACETAMINOPHEN 5-325 MG PO TABS
2.0000 | ORAL_TABLET | Freq: Once | ORAL | Status: AC
  Administered 2023-10-03: 2 via ORAL
  Filled 2023-10-03: qty 2

## 2023-10-03 MED ORDER — METHOCARBAMOL 500 MG PO TABS: 500.0000 mg | ORAL_TABLET | Freq: Two times a day (BID) | ORAL | 0 refills | Status: AC

## 2023-10-03 NOTE — ED Notes (Signed)
ED Provider at bedside. 

## 2023-10-03 NOTE — ED Triage Notes (Signed)
Pt presents via POV c/o right sided flank pain. Reports has had a kidney stone in the past.   Pt reports PMH of HTN however not compliant with medications.

## 2023-10-03 NOTE — Discharge Instructions (Addendum)
CT without any stones today that should be causing issues. May be muscular in nature.   Can try the medication to see if it helps. Follow-up with your doctor. Return here for new concerns.

## 2023-10-03 NOTE — ED Provider Notes (Signed)
[place] [facility] AT [facility] Provider Note   CSN: [id number] Arrival date & time: 10/03/23  0430     History  Chief Complaint  Patient presents with   Flank Pain    Thomas Owen is a 42 y.o. male.  The history is provided by the patient and medical records.  Flank Pain   42 year old male with history of kidney stones, presenting to the ED with right flank pain.  States it began last night, ongoing all night.  Pain in right lower back, shoots across to left side of back.  No radiation in to the legs.  Denies any nausea or vomiting.  No difficulty urinating.  No noted hematuria.  History of kidney stones previously which felt similar.  He was able to pass those and did not require intervention.  He took Motrin at home with mild relief.  Home Medications Prior to Admission medications   Medication Sig Start Date End Date Taking? Authorizing Provider  methocarbamol (ROBAXIN) 500 MG tablet Take 1 tablet (500 mg total) by mouth 2 (two) times daily. 10/03/23  Yes Garlon Hatchet, PA-C  naproxen (NAPROSYN) 500 MG tablet Take 1 tablet (500 mg total) by mouth 2 (two) times daily. 10/03/23  Yes Garlon Hatchet, PA-C  escitalopram (LEXAPRO) 10 MG tablet Take 1 tablet (10 mg total) by mouth daily. 07/13/21 08/12/21  Ivonne Andrew, NP  ibuprofen (ADVIL,MOTRIN) 200 MG tablet Take 600 mg by mouth every 6 (six) hours as needed for moderate pain.    [provider]  omeprazole (PRILOSEC) 20 MG capsule Take 1 capsule (20 mg total) by mouth daily. 07/13/21   Ivonne Andrew, NP  tamsulosin (FLOMAX) 0.4 MG CAPS capsule Take 1 capsule (0.4 mg total) by mouth daily after supper. 04/01/18   Gerhard Munch, MD  traMADol (ULTRAM) 50 MG tablet Take 1 tablet (50 mg total) by mouth every 6 (six) hours as needed. 04/01/18   Gerhard Munch, MD      Allergies    Patient has no known allergies.    Review of Systems   Review of Systems  Genitourinary:  Positive for flank  pain.  All other systems reviewed and are negative.   Physical Exam Updated Vital Signs BP 124/79   Pulse 72   Temp 98.8 F (37.1 C)   Resp 16   SpO2 96%   Physical Exam Vitals and nursing note reviewed.  Constitutional:      Appearance: He is well-developed.  HENT:     Head: Normocephalic and atraumatic.  Eyes:     Conjunctiva/sclera: Conjunctivae normal.     Pupils: Pupils are equal, round, and reactive to light.  Cardiovascular:     Rate and Rhythm: Normal rate and regular rhythm.     Heart sounds: Normal heart sounds.  Pulmonary:     Effort: Pulmonary effort is normal. No respiratory distress.     Breath sounds: Normal breath sounds. No rhonchi.  Abdominal:     General: Bowel sounds are normal.     Palpations: Abdomen is soft.     Tenderness: There is no abdominal tenderness. There is no rebound.  Musculoskeletal:        General: Normal range of motion.     Cervical back: Normal range of motion.  Skin:    General: Skin is warm and dry.  Neurological:     Mental Status: He is alert and oriented to person, place, and time.     ED  Results / Procedures / Treatments   Labs (all labs ordered are listed, but only abnormal results are displayed) Labs Reviewed  URINALYSIS, ROUTINE W REFLEX MICROSCOPIC - Abnormal; Notable for the following components:      Result Value   Bacteria, UA RARE (*)    All other components within normal limits  BASIC METABOLIC PANEL - Abnormal; Notable for the following components:   Sodium 134 (*)    Glucose, Bld 132 (*)    All other components within normal limits  CBC  TROPONIN I (HIGH SENSITIVITY)    EKG None  Radiology CT Renal Stone Study  Result Date: 10/03/2023 CLINICAL DATA:  Abdominal/flank pain with stone suspected EXAM: CT ABDOMEN AND PELVIS WITHOUT CONTRAST TECHNIQUE: Multidetector CT imaging of the abdomen and pelvis was performed following the standard protocol without IV contrast. RADIATION DOSE REDUCTION: This exam  was performed according to the departmental dose-optimization program which includes automated exposure control, adjustment of the mA and/or kV according to patient size and/or use of iterative reconstruction technique. COMPARISON:  04/01/2018 FINDINGS: Lower chest:  No contributory findings. Hepatobiliary: Mild hepatic steatosis based on pericholecystic sparing.No evidence of biliary obstruction or stone. Pancreas: Unremarkable. Spleen: Unremarkable. Adrenals/Urinary Tract: Negative adrenals. Numerous bilateral renal calculi scattered in the bilateral kidney, largest calculi measuring up to 3 mm at the upper poles. No hydronephrosis or ureteral calculus. Negative, decompressed bladder. Stomach/Bowel:  No obstruction. No appendicitis. Vascular/Lymphatic: No acute vascular abnormality. No mass or adenopathy. Reproductive:No pathologic findings. Other: No ascites or pneumoperitoneum. Musculoskeletal: No acute abnormalities. IMPRESSION: 1. No acute finding.  No hydronephrosis or ureteral stone. 2. Multiple bilateral renal calculi. Electronically Signed   By: Tiburcio Pea M.D.   On: 10/03/2023 05:15    Procedures Procedures    Medications Ordered in ED Medications  oxyCODONE-acetaminophen (PERCOCET/ROXICET) 5-325 MG per tablet 2 tablet (2 tablets Oral Given 10/03/23 2956)    ED Course/ Medical Decision Making/ A&P                                 Medical Decision Making Amount and/or Complexity of Data Reviewed Radiology: ordered and independent interpretation performed. ECG/medicine tests: ordered and independent interpretation performed.  Risk Prescription drug management.   42 year old male presenting to the ED with right-sided back pain.  States began last night and has persisted throughout the night.  Some radiation to his left side but not down the legs.  No nausea or vomiting.  No difficulty urinating or hematuria.  History of kidney stones with similar symptoms.  Afebrile, nontoxic.  He  is in no acute distress.  He actually appears quite comfortable.  Did have some relief with Motrin.  Suspect that this may actually be musculoskeletal, however given his history we will proceed with labs, UA, CT.  Labs as above--no leukocytosis or electrolyte derangement.  UA without hematuria.  CT with bilateral renal stones, however no acute ureteral calculus or findings of recent passage.  Reports improvement of pain after medications here.  Suspect this is likely musculoskeletal.  Does admit to heavy lifting frequently at work.  He does not have any red flag symptoms.  Feel he is stable for discharge with symptomatic management.  Can follow-up with PCP.  Return here for new concerns.  Final Clinical Impression(s) / ED Diagnoses Final diagnoses:  Flank pain    Rx / DC Orders ED Discharge Orders  Ordered    methocarbamol (ROBAXIN) 500 MG tablet  2 times daily        10/03/23 0553    naproxen (NAPROSYN) 500 MG tablet  2 times daily        10/03/23 0553              Garlon Hatchet, PA-C 10/03/23 0559    Sabas Sous, MD 10/03/23 [phone]

## 2023-10-03 NOTE — ED Notes (Signed)
Patient transported to CT 

## 2023-10-03 NOTE — ED Notes (Signed)
Patient educated about not driving or performing other critical tasks (such as operating heavy machinery, caring for infant/toddler/child) due to sedative nature of narcotic medications received while in the ED.  Pt/caregiver verbalized understanding.
